# Patient Record
Sex: Female | Born: 1990 | Race: White | Hispanic: Yes | Marital: Single | Smoking: Never smoker
Health system: Southern US, Community
[De-identification: ages and names within clinical notes are randomized; demographics above are authoritative.]

---
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
## 2020-09-26 ENCOUNTER — Ambulatory Visit (INDEPENDENT_AMBULATORY_CARE_PROVIDER_SITE_OTHER): Payer: Self-pay

## 2020-09-26 ENCOUNTER — Encounter (HOSPITAL_COMMUNITY): Payer: Self-pay | Admitting: Emergency Medicine

## 2020-09-26 ENCOUNTER — Ambulatory Visit (HOSPITAL_COMMUNITY)
Admission: EM | Admit: 2020-09-26 | Discharge: 2020-09-26 | Disposition: A | Discharge status: Home / Self Care | Payer: Self-pay | Attending: Emergency Medicine | Admitting: Emergency Medicine

## 2020-09-26 ENCOUNTER — Other Ambulatory Visit: Payer: Self-pay

## 2020-09-26 DIAGNOSIS — M545 Low back pain, unspecified: Secondary | ICD-10-CM

## 2020-09-26 DIAGNOSIS — S299XXA Unspecified injury of thorax, initial encounter: Secondary | ICD-10-CM

## 2020-09-26 MED ORDER — METHOCARBAMOL 500 MG PO TABS: 500.0000 mg | ORAL_TABLET | Freq: Three times a day (TID) | ORAL | 0 refills | Status: AC | PRN

## 2020-09-26 MED ORDER — MELOXICAM 7.5 MG PO TABS: 7.5000 mg | ORAL_TABLET | Freq: Two times a day (BID) | ORAL | 1 refills | Status: AC
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
## 2020-09-26 NOTE — Discharge Instructions (Signed)
Take meloxicam and Robaxin as directed. 

## 2020-10-26 NOTE — L&D Delivery Note (Signed)
Delivery Note She progressed to complete and pushed well.  At 10:53 AM a viable female was delivered via Vaginal, Spontaneous (Presentation: Left Occiput Transverse).  APGAR: 9, 9; weight  .   Placenta status: Spontaneous, Intact.  Cord: 3 vessels with the following complications: Loose nuchal x 1 reduced   Anesthesia: Local Episiotomy: None Lacerations: Bilateral Labial;1st degree;Perineal Suture Repair: 3.0 vicryl rapide Est. Blood Loss (mL):  150  Mom to postpartum.  Baby to Couplet care / Skin to Skin.  They do not want baby circumcised  Leighton Roach Paolo Okane 07/21/2021, 11:22 AM

## 2020-12-23 LAB — OB RESULTS CONSOLE GC/CHLAMYDIA: Chlamydia: NEGATIVE

## 2020-12-23 LAB — OB RESULTS CONSOLE HEPATITIS B SURFACE ANTIGEN: Hepatitis B Surface Ag: NEGATIVE

## 2020-12-23 LAB — OB RESULTS CONSOLE RUBELLA ANTIBODY, IGM: Rubella: IMMUNE
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
## 2021-05-08 LAB — OB RESULTS CONSOLE HIV ANTIBODY (ROUTINE TESTING): HIV: NONREACTIVE

## 2021-07-03 LAB — OB RESULTS CONSOLE GBS: GBS: NEGATIVE

## 2021-07-20 ENCOUNTER — Inpatient Hospital Stay (HOSPITAL_COMMUNITY)
Admission: AD | Admit: 2021-07-20 | Discharge: 2021-07-23 | DRG: 806 | Disposition: A | Discharge status: Home / Self Care | Payer: Self-pay | Attending: Obstetrics and Gynecology | Admitting: Obstetrics and Gynecology

## 2021-07-20 ENCOUNTER — Other Ambulatory Visit: Payer: Self-pay

## 2021-07-20 ENCOUNTER — Encounter (HOSPITAL_COMMUNITY): Payer: Self-pay | Admitting: Obstetrics and Gynecology

## 2021-07-20 DIAGNOSIS — O4292 Full-term premature rupture of membranes, unspecified as to length of time between rupture and onset of labor: Principal | ICD-10-CM | POA: Diagnosis present

## 2021-07-20 DIAGNOSIS — Z3A38 38 weeks gestation of pregnancy: Secondary | ICD-10-CM

## 2021-07-20 DIAGNOSIS — O99324 Drug use complicating childbirth: Secondary | ICD-10-CM | POA: Diagnosis present

## 2021-07-20 DIAGNOSIS — F1290 Cannabis use, unspecified, uncomplicated: Secondary | ICD-10-CM | POA: Diagnosis present

## 2021-07-20 DIAGNOSIS — Z20822 Contact with and (suspected) exposure to covid-19: Secondary | ICD-10-CM | POA: Diagnosis present

## 2021-07-20 LAB — POCT FERN TEST: POCT Fern Test: POSITIVE

## 2021-07-20 NOTE — MAU Note (Signed)
Shelby Mathews is a 30 y.o. at [redacted]w[redacted]d here in MAU reporting: possible SROM at 0900 this morning noting clear fluid. Has continued to leak throughout the day-reports at 1930 this evening noted the fluid had pink tinge and she began feeling cramping in lower abdomen. States baby is active . Denies problems or complications with pregnancy  Vitals:   07/20/21 2202 07/20/21 2235  BP: 128/87 113/75  Pulse: 79 87  Resp: 16 16  Temp: 98.1 F (36.7 C) 98.5 F (36.9 C)  SpO2: 100% 99%     FHT:137 Lab orders placed from triage:  mau labor

## 2021-07-20 NOTE — H&P (Signed)
30 y.o. [redacted]w[redacted]d  G1P0 pt of [facility] comes in c/o SROM this morning with small trickle but did not come in until the fluid looked pinkish this pm.  Otherwise has good fetal movement and no bleeding.  History reviewed. No pertinent past medical history. History reviewed. No pertinent surgical history.  OB History  Gravida Para Term Preterm AB Living  1            SAB IAB Ectopic Multiple Live Births               # Outcome Date GA Lbr Len/2nd Weight Sex Delivery Anes PTL Lv  1 Current             Social History   Socioeconomic History   Marital status: Single    Spouse name: Not on file   Number of children: Not on file   Years of education: Not on file   Highest education level: Not on file  Occupational History   Not on file  Tobacco Use   Smoking status: Never   Smokeless tobacco: Never  Substance and Sexual Activity   Alcohol use: Yes    Comment: rare   Drug use: Yes    Types: Marijuana   Sexual activity: Not on file  Other Topics Concern   Not on file  Social History Narrative   Not on file   Social Determinants of Health   Financial Resource Strain: Not on file  Food Insecurity: Not on file  Transportation Needs: Not on file  Physical Activity: Not on file  Stress: Not on file  Social Connections: Not on file  Intimate Partner Violence: Not on file   Patient has no known allergies.    Prenatal Transfer Tool  Maternal Diabetes: No Genetic Screening: Normal Maternal Ultrasounds/Referrals: Normal Fetal Ultrasounds or other Referrals:  None Maternal Substance Abuse:  No Significant Maternal Medications:  None Significant Maternal Lab Results: Group B Strep negative  Other PNC: uncomplicated.    Vitals:   07/20/21 2202 07/20/21 2233 07/20/21 2235  BP: 128/87  113/75  Pulse: 79  87  Resp: 16  16  Temp: 98.1 F (36.7 C)  98.5 F (36.9 C)  TempSrc:   Oral  SpO2: 100%  99%  Weight:  63.5 kg 64.5 kg  Height:  5\' 1"  (1.549 m) 5\' 1"  (1.549 m)     Lungs/Cor:  NAD Abdomen:  soft, gravid Ex:  no cords, erythema SVE:  1.5/90/-1 FHTs:  140s, good STV, NST R; Cat 1 tracing. Toco:  q occ   A/P   Term SROM, possibly since 0900 but NST R and rare contractions.  GBS neg.  Will start pit.  

## 2021-07-20 NOTE — ED Provider Notes (Signed)
Emergency Medicine Provider Triage Evaluation Note  Shelby Mathews , a 30 y.o. female  was evaluated in triage.  Pt complains of vaginal bleeding.  She is [redacted] weeks pregnant.  States she felt a rush of fluids this morning and has had spotting ever since.  Denies contractions, is in no distress at this time.  Review of Systems  Positive: Vaginal bleeding Negative: Contractions  Physical Exam  BP 128/87 (BP Location: Right Arm)   Pulse 79   Temp 98.1 F (36.7 C)   Resp 16   SpO2 100%  Gen:   Awake, no distress   Resp:  Normal effort  MSK:   Moves extremities without difficulty    Medical Decision Making  Medically screening exam initiated at 10:02 PM.  Appropriate orders placed.  Shelby Mathews was informed that the remainder of the evaluation will be completed by another provider, this initial triage assessment does not replace that evaluation, and the importance of remaining in the ED until their evaluation is complete.  Denny Peon, MAU APP called and accepted patient for transfer   Vear Clock 07/20/21 2205    Benjiman Core, MD 07/21/21 [phone]

## 2021-07-20 NOTE — ED Triage Notes (Signed)
The pt is [redacted] weeks pregnant  lmp oct 8th  edc dec  fluid leaking this am  this evening some light vaginal bleeding c/o some mild lower abd cramps.   Mau called  will be sendinf

## 2021-07-21 ENCOUNTER — Encounter (HOSPITAL_COMMUNITY): Payer: Self-pay | Admitting: Obstetrics and Gynecology

## 2021-07-21 LAB — TYPE AND SCREEN
ABO/RH(D): AB POS
Antibody Screen: NEGATIVE

## 2021-07-21 LAB — CBC
HCT: 35.1 % — ABNORMAL LOW (ref 36.0–46.0)
Hemoglobin: 11.6 g/dL — ABNORMAL LOW (ref 12.0–15.0)
MCH: 30.4 pg (ref 26.0–34.0)
MCHC: 33 g/dL (ref 30.0–36.0)
MCV: 92.1 fL (ref 80.0–100.0)
Platelets: 231 10*3/uL (ref 150–400)
RBC: 3.81 MIL/uL — ABNORMAL LOW (ref 3.87–5.11)
RDW: 14.3 % (ref 11.5–15.5)
WBC: 7.7 10*3/uL (ref 4.0–10.5)
nRBC: 0 % (ref 0.0–0.2)

## 2021-07-21 LAB — RESP PANEL BY RT-PCR (FLU A&B, COVID) ARPGX2
Influenza A by PCR: NEGATIVE
Influenza B by PCR: NEGATIVE
SARS Coronavirus 2 by RT PCR: NEGATIVE

## 2021-07-21 LAB — RPR: RPR Ser Ql: NONREACTIVE

## 2021-07-21 LAB — POCT FERN TEST

## 2021-07-21 MED ORDER — IBUPROFEN 600 MG PO TABS
600.0000 mg | ORAL_TABLET | Freq: Four times a day (QID) | ORAL | Status: DC
  Administered 2021-07-21 – 2021-07-23 (×9): 600 mg via ORAL
  Filled 2021-07-21 (×9): qty 1

## 2021-07-21 MED ORDER — ONDANSETRON HCL 4 MG PO TABS: 4.0000 mg | ORAL_TABLET | ORAL | Status: DC | PRN

## 2021-07-21 MED ORDER — COCONUT OIL OIL: 1.0000 "application " | TOPICAL_OIL | Status: DC | PRN

## 2021-07-21 MED ORDER — ACETAMINOPHEN 325 MG PO TABS: 650.0000 mg | ORAL_TABLET | ORAL | Status: DC | PRN

## 2021-07-21 MED ORDER — EPHEDRINE 5 MG/ML INJ: 10.0000 mg | INTRAVENOUS | Status: DC | PRN

## 2021-07-21 MED ORDER — OXYTOCIN BOLUS FROM INFUSION
333.0000 mL | Freq: Once | INTRAVENOUS | Status: AC
  Administered 2021-07-21: 333 mL via INTRAVENOUS

## 2021-07-21 MED ORDER — LACTATED RINGERS IV SOLN: INTRAVENOUS | Status: DC

## 2021-07-21 MED ORDER — OXYCODONE-ACETAMINOPHEN 5-325 MG PO TABS: 2.0000 | ORAL_TABLET | ORAL | Status: DC | PRN

## 2021-07-21 MED ORDER — WITCH HAZEL-GLYCERIN EX PADS: 1.0000 "application " | MEDICATED_PAD | CUTANEOUS | Status: DC | PRN

## 2021-07-21 MED ORDER — METHYLERGONOVINE MALEATE 0.2 MG PO TABS
0.2000 mg | ORAL_TABLET | ORAL | Status: DC | PRN
Start: 2021-07-21 — End: 2021-07-23

## 2021-07-21 MED ORDER — OXYTOCIN-SODIUM CHLORIDE 30-0.9 UT/500ML-% IV SOLN
1.0000 m[IU]/min | INTRAVENOUS | Status: DC
  Administered 2021-07-21: 2 m[IU]/min via INTRAVENOUS
  Filled 2021-07-21: qty 500

## 2021-07-21 MED ORDER — SENNOSIDES-DOCUSATE SODIUM 8.6-50 MG PO TABS
2.0000 | ORAL_TABLET | Freq: Every day | ORAL | Status: DC
  Administered 2021-07-22 – 2021-07-23 (×2): 2 via ORAL
  Filled 2021-07-21 (×2): qty 2

## 2021-07-21 MED ORDER — TETANUS-DIPHTH-ACELL PERTUSSIS 5-2.5-18.5 LF-MCG/0.5 IM SUSY: 0.5000 mL | PREFILLED_SYRINGE | Freq: Once | INTRAMUSCULAR | Status: DC

## 2021-07-21 MED ORDER — ZOLPIDEM TARTRATE 5 MG PO TABS: 5.0000 mg | ORAL_TABLET | Freq: Every evening | ORAL | Status: DC | PRN

## 2021-07-21 MED ORDER — EPHEDRINE 5 MG/ML INJ
10.0000 mg | INTRAVENOUS | Status: DC | PRN
Start: 2021-07-21 — End: 2021-07-21

## 2021-07-21 MED ORDER — TERBUTALINE SULFATE 1 MG/ML IJ SOLN: 0.2500 mg | Freq: Once | INTRAMUSCULAR | Status: DC | PRN

## 2021-07-21 MED ORDER — PHENYLEPHRINE 40 MCG/ML (10ML) SYRINGE FOR IV PUSH (FOR BLOOD PRESSURE SUPPORT): 80.0000 ug | PREFILLED_SYRINGE | INTRAVENOUS | Status: DC | PRN

## 2021-07-21 MED ORDER — SOD CITRATE-CITRIC ACID 500-334 MG/5ML PO SOLN: 30.0000 mL | ORAL | Status: DC | PRN

## 2021-07-21 MED ORDER — LACTATED RINGERS IV SOLN: 500.0000 mL | INTRAVENOUS | Status: DC | PRN

## 2021-07-21 MED ORDER — OXYCODONE HCL 5 MG PO TABS: 5.0000 mg | ORAL_TABLET | ORAL | Status: DC | PRN

## 2021-07-21 MED ORDER — FENTANYL-BUPIVACAINE-NACL 0.5-0.125-0.9 MG/250ML-% EP SOLN: 12.0000 mL/h | EPIDURAL | Status: DC | PRN

## 2021-07-21 MED ORDER — SIMETHICONE 80 MG PO CHEW: 80.0000 mg | CHEWABLE_TABLET | ORAL | Status: DC | PRN

## 2021-07-21 MED ORDER — FLEET ENEMA 7-19 GM/118ML RE ENEM: 1.0000 | ENEMA | RECTAL | Status: DC | PRN

## 2021-07-21 MED ORDER — DIPHENHYDRAMINE HCL 25 MG PO CAPS: 25.0000 mg | ORAL_CAPSULE | Freq: Four times a day (QID) | ORAL | Status: DC | PRN

## 2021-07-21 MED ORDER — DIBUCAINE (PERIANAL) 1 % EX OINT: 1.0000 "application " | TOPICAL_OINTMENT | CUTANEOUS | Status: DC | PRN

## 2021-07-21 MED ORDER — OXYTOCIN-SODIUM CHLORIDE 30-0.9 UT/500ML-% IV SOLN: 2.5000 [IU]/h | INTRAVENOUS | Status: DC

## 2021-07-21 MED ORDER — OXYCODONE HCL 5 MG PO TABS: 10.0000 mg | ORAL_TABLET | ORAL | Status: DC | PRN

## 2021-07-21 MED ORDER — LACTATED RINGERS IV SOLN: 500.0000 mL | Freq: Once | INTRAVENOUS | Status: DC

## 2021-07-21 MED ORDER — BENZOCAINE-MENTHOL 20-0.5 % EX AERO
1.0000 "application " | INHALATION_SPRAY | CUTANEOUS | Status: DC | PRN
  Administered 2021-07-21: 1 via TOPICAL
  Filled 2021-07-21: qty 56

## 2021-07-21 MED ORDER — MEASLES, MUMPS & RUBELLA VAC IJ SOLR: 0.5000 mL | Freq: Once | INTRAMUSCULAR | Status: DC

## 2021-07-21 MED ORDER — PRENATAL MULTIVITAMIN CH
1.0000 | ORAL_TABLET | Freq: Every day | ORAL | Status: DC
  Administered 2021-07-22 – 2021-07-23 (×2): 1 via ORAL
  Filled 2021-07-21 (×2): qty 1

## 2021-07-21 MED ORDER — LIDOCAINE HCL (PF) 1 % IJ SOLN
30.0000 mL | INTRAMUSCULAR | Status: AC | PRN
  Administered 2021-07-21: 30 mL via SUBCUTANEOUS
  Filled 2021-07-21: qty 30

## 2021-07-21 MED ORDER — OXYCODONE-ACETAMINOPHEN 5-325 MG PO TABS: 1.0000 | ORAL_TABLET | ORAL | Status: DC | PRN

## 2021-07-21 MED ORDER — METHYLERGONOVINE MALEATE 0.2 MG/ML IJ SOLN
0.2000 mg | INTRAMUSCULAR | Status: DC | PRN
Start: 2021-07-21 — End: 2021-07-23

## 2021-07-21 MED ORDER — MAGNESIUM HYDROXIDE 400 MG/5ML PO SUSP: 30.0000 mL | ORAL | Status: DC | PRN

## 2021-07-21 MED ORDER — ONDANSETRON HCL 4 MG/2ML IJ SOLN: 4.0000 mg | INTRAMUSCULAR | Status: DC | PRN

## 2021-07-21 MED ORDER — DIPHENHYDRAMINE HCL 50 MG/ML IJ SOLN: 12.5000 mg | INTRAMUSCULAR | Status: DC | PRN

## 2021-07-21 MED ORDER — ONDANSETRON HCL 4 MG/2ML IJ SOLN: 4.0000 mg | Freq: Four times a day (QID) | INTRAMUSCULAR | Status: DC | PRN

## 2021-07-21 MED ORDER — BUTORPHANOL TARTRATE 1 MG/ML IJ SOLN
1.0000 mg | INTRAMUSCULAR | Status: DC | PRN
  Administered 2021-07-21 (×3): 1 mg via INTRAVENOUS
  Filled 2021-07-21 (×3): qty 1

## 2021-07-21 NOTE — Lactation Note (Signed)
This note was copied from a baby's chart. Lactation Consultation Note  Patient Name: Shelby Mathews [id number] Date: 07/21/2021 Reason for consult: L&D Initial assessment;Mother's request;Primapara;1st time breastfeeding;Early term 37-38.6wks Age: < 1hr  Infant latched with signs of milk transfer.  Mom to call for latch assistance on the floor.  Mom wants to BF and bottle feed, need a pump to support her milk supply.  All questions answered at end of the visit.   Maternal Data Has patient been taught Hand Expression?: Yes Does the patient have breastfeeding experience prior to this delivery?: No  Feeding Mother's Current Feeding Choice: Breast Milk and Formula  LATCH Score Latch: Repeated attempts needed to sustain latch, nipple held in mouth throughout feeding, stimulation needed to elicit sucking reflex.  Audible Swallowing: Spontaneous and intermittent  Type of Nipple: Everted at rest and after stimulation  Comfort (Breast/Nipple): Soft / non-tender  Hold (Positioning): Assistance needed to correctly position infant at breast and maintain latch.  LATCH Score: 8   Lactation Tools Discussed/Used    Interventions Interventions: Breast feeding basics reviewed;Breast compression;Assisted with latch;Skin to skin;Support pillows;Position options;Hand express;Expressed milk;Education  Discharge    Consult Status Consult Status: Follow-up from L&D Date: 07/22/21 Follow-up type: In-patient    Joelyn Lover  Nicholson-Springer 07/21/2021, 12:25 PM

## 2021-07-21 NOTE — Lactation Note (Signed)
This note was copied from a baby's chart. Lactation Consultation Note  Patient Name: Shelby Mathews Date: 07/21/2021 Reason for consult: Initial assessment;Early term 37-38.6wks Age:29 hours  Initial visit to 12 hours old infant of a P1 mother. "Shelby Mathews" just had a bath and is skin to skin with mother. Per mother, he does not like the formula. Demonstrated hand expression, collected ~30mL and spoonfed. Bettey Mare is still cueing, provided assistance with latch.  Discussed normal newborn behavior and patterns, signs of good milk transfer, hunger cues, tummy size and benefits of skin to skin.  Provided a hand pump to assist with expression.  Plan: 1-Deep, comfortable latch and breastfeeding on demand or 8-12 times in 24h period. 2-Encouraged maternal rest, hydration and food intake.  3-Contact LC as needed for feeds/support/concerns/questions   All questions answered at this time. Provided Lactation services brochure and promoted [employer] booklet information.     Maternal Data Has patient been taught Hand Expression?: Yes Does the patient have breastfeeding experience prior to this delivery?: No  Feeding Mother's Current Feeding Choice: Breast Milk and Formula Nipple Type: Slow - flow  LATCH Score Latch: Grasps breast easily, tongue down, lips flanged, rhythmical sucking.  Audible Swallowing: Spontaneous and intermittent  Type of Nipple: Everted at rest and after stimulation  Comfort (Breast/Nipple): Soft / non-tender  Hold (Positioning): Assistance needed to correctly position infant at breast and maintain latch.  LATCH Score: 9   Lactation Tools Discussed/Used Tools: Pump;Flanges Flange Size: 24 Breast pump type: Manual Pump Education: Setup, frequency, and cleaning;Milk Storage Reason for Pumping: stimulation and supplementation Pumping frequency: as needed Pumped volume: 2 mL (with demonstration)  Interventions Interventions: Breast feeding basics  reviewed;Assisted with latch;Skin to skin;Breast massage;Hand express;Adjust position;Support pillows;Education;Hand pump;Expressed milk;Position options  Discharge Pump: Manual WIC Program: No  Consult Status Consult Status: Follow-up Date: 07/22/21 Follow-up type: In-patient    Maurilio Puryear A Higuera Ancidey 07/21/2021, 11:48 PM

## 2021-07-21 NOTE — Lactation Note (Signed)
This note was copied from a baby's chart. Lactation Consultation Note  Patient Name: Shelby Mathews [id number] Date: 07/21/2021 Age:29 hours  LC in to room for initial consult. Baby is getting a bath and assessment.  LC will come back to room as possible.   Feeding Nipple Type: Slow - flow  [name] 07/21/2021, 11:10 PM

## 2021-07-21 NOTE — Progress Notes (Signed)
Feeling ctx, doing ok with Stadol Afeb, VSS FHT- 120s, Cat I, ctx q 2-3 min on pitocin VE4/C/+2 per RN  Will continue pitocin, monitor progress, anticipate SVD

## 2021-07-22 NOTE — Lactation Note (Signed)
This note was copied from a baby's chart. Lactation Consultation Note  Patient Name: Shelby Mathews [id number] Date: 07/22/2021 Reason for consult: Follow-up assessment Age:29 hours  LC Follow Up Note:  Attempted to visit with mother, however, she is asleep at this time.  Will return later today.   Maternal Data    Feeding    LATCH Score                    Lactation Tools Discussed/Used    Interventions    Discharge    Consult Status Consult Status: Follow-up Date: 07/22/21 Follow-up type: In-patient    Madalyn Legner R Simra Fiebig 07/22/2021, 1:12 PM

## 2021-07-22 NOTE — Lactation Note (Signed)
This note was copied from a baby's chart. Lactation Consultation Note  Patient Name: Shelby Mathews [id number] Date: 07/22/2021 Reason for consult: Follow-up assessment Age:29 hours   LC Follow Up Note:  Attempted to visit with mother, however, she was asleep.  Will return later today.   Maternal Data    Feeding    LATCH Score                    Lactation Tools Discussed/Used    Interventions    Discharge    Consult Status Consult Status: Follow-up Date: 07/22/21 Follow-up type: In-patient    Robi Mitter R Mitra Duling 07/22/2021, 2:16 PM

## 2021-07-22 NOTE — Lactation Note (Signed)
This note was copied from a baby's chart. Lactation Consultation Note  Patient Name: Boy Jossalyn Forgione [id number] Date: 07/22/2021 Reason for consult: Follow-up assessment;Early term 37-38.6wks;Primapara;1st time breastfeeding Age:29 hours   P1 mother whose infant is now 66 hours old.  This is an ETI at 38+5 weeks.  Baby was being held by grandfather when I arrived.  Mother reported recently breast feeding.  She had no questions/concerns related to breast feeding.  Baby has been latching well; mother denied pain with latching.  Baby is voiding/stooling well.  She will continue to feed 8-12 times/24 hours or sooner if baby shows cues.  Suggested she call her RN/LC for assistance as desired.    Mother has a DEBP for home use.  Father and grandparents present and supportive.   Maternal Data Has patient been taught Hand Expression?: Yes Does the patient have breastfeeding experience prior to this delivery?: Yes  Feeding Mother's Current Feeding Choice: Breast Milk  LATCH Score Latch: Grasps breast easily, tongue down, lips flanged, rhythmical sucking.  Audible Swallowing: A few with stimulation  Type of Nipple: Everted at rest and after stimulation  Comfort (Breast/Nipple): Soft / non-tender  Hold (Positioning): Assistance needed to correctly position infant at breast and maintain latch.  LATCH Score: 8   Lactation Tools Discussed/Used Tools: Pump Flange Size: 24 Breast pump type: Manual Reason for Pumping: Breast stimulation Pumping frequency: PRN  Interventions Interventions: Education  Discharge Pump: Manual;Personal WIC Program: No  Consult Status Consult Status: Follow-up Date: 07/23/21 Follow-up type: In-patient    Dora Sims 07/22/2021, 5:28 PM
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
## 2021-07-22 NOTE — Progress Notes (Signed)
Post Partum Day 1 Subjective: no complaints, up ad lib, voiding, and tolerating PO  Objective: Blood pressure 96/63, pulse 92, temperature 98.7 F (37.1 C), temperature source Oral, resp. rate 18, height 5\' 1"  (1.549 m), weight 64.5 kg, last menstrual period 09/26/2020, SpO2 99 %, unknown if currently breastfeeding.  Physical Exam:  General: alert, cooperative, and appears stated age Lochia: appropriate Uterine Fundus: firm DVT Evaluation: No evidence of DVT seen on physical exam.  Recent Labs    07/20/21 2345  HGB 11.6*  HCT 35.1*    Assessment/Plan: Plan for discharge tomorrow Declines circ Breastfeeding   LOS: 2 days   07/22/21 07/22/2021, 10:42 AM

## 2021-07-23 MED ORDER — IBUPROFEN 800 MG PO TABS: 800.0000 mg | ORAL_TABLET | Freq: Three times a day (TID) | ORAL | 1 refills | Status: AC | PRN

## 2021-07-23 NOTE — Discharge Summary (Signed)
Postpartum Discharge Summary  Date of Service updated     Patient Name: Shelby Mathews DOB: 1991/04/16 MRN: [id number]  Date of admission: 07/20/2021 Delivery date:07/21/2021  Delivering provider: Jackelyn Knife, TODD  Date of discharge: 07/23/2021  Admitting diagnosis: Normal labor [O80, Z37.9] Intrauterine pregnancy: [redacted]w[redacted]d     Secondary diagnosis:  Active Problems:   SVD (spontaneous vaginal delivery)  Additional problems: none    Discharge diagnosis: Term Pregnancy Delivered                                              Post partum procedures: none Augmentation: Pitocin Complications: None  Hospital course: Induction of Labor With Vaginal Delivery   30 y.o. yo G1P1001 at [redacted]w[redacted]d was admitted to the hospital 07/20/2021 for induction of labor.  Indication for induction:  PROM .  Patient had an uncomplicated labor course as follows: Membrane Rupture Time/Date: 9:00 AM ,07/20/2021   Delivery Method:Vaginal, Spontaneous  Episiotomy: None  Lacerations:  Labial;1st degree;Perineal  Details of delivery can be found in separate delivery note.  Patient had a routine postpartum course. Patient is discharged home 07/23/21.  Newborn Data: Birth date:07/21/2021  Birth time:10:53 AM  Gender:Female  Living status:Living  Apgars:9 ,9  Weight:2855 g   Physical exam  Vitals:   07/22/21 0200 07/22/21 1528 07/22/21 2056 07/23/21 0500  BP: 96/63 (!) 100/59 (!) 91/58 (!) 84/58  Pulse: 92 77 89 72  Resp: 18 18 18 18   Temp: 98.7 F (37.1 C) 98.5 F (36.9 C) 98.5 F (36.9 C) 98.8 F (37.1 C)  TempSrc: Oral Oral Oral Oral  SpO2:      Weight:      Height:       General: alert, cooperative, and no distress Lochia: appropriate Uterine Fundus: firm Incision: N/A DVT Evaluation: No evidence of DVT seen on physical exam. Negative Homan's sign. No cords or calf tenderness. Labs: Lab Results  Component Value Date   WBC 7.7 07/20/2021   HGB 11.6 (L) 07/20/2021   HCT 35.1 (L) 07/20/2021    MCV 92.1 07/20/2021   PLT 231 07/20/2021   No flowsheet data found. Edinburgh Score: Edinburgh Postnatal Depression Scale Screening Tool 07/21/2021  I have been able to laugh and see the funny side of things. 0  I have looked forward with enjoyment to things. 0  I have blamed myself unnecessarily when things went wrong. 0  I have been anxious or worried for no good reason. 0  I have felt scared or panicky for no good reason. 0  Things have been getting on top of me. 0  I have been so unhappy that I have had difficulty sleeping. 0  I have felt sad or miserable. 0  I have been so unhappy that I have been crying. 0  The thought of harming myself has occurred to me. 0  Edinburgh Postnatal Depression Scale Total 0      After visit meds:  Allergies as of 07/23/2021   No Known Allergies      Medication List     TAKE these medications    ibuprofen 800 MG tablet Commonly known as: ADVIL Take 1 tablet (800 mg total) by mouth every 8 (eight) hours as needed.         Discharge home in stable condition Infant Feeding: Breast Infant Disposition:home with mother Discharge instruction: per After Visit  Summary and Postpartum booklet. Activity: Advance as tolerated. Pelvic rest for 6 weeks.  Diet: routine diet Anticipated Birth Control: Unsure Postpartum Appointment:6 weeks    07/23/2021 Carlisle Cater, MD

## 2021-07-23 NOTE — Lactation Note (Signed)
This note was copied from a baby's chart. Lactation Consultation Note  Patient Name: Shelby Mathews [id number] Date: 07/23/2021 Reason for consult: Follow-up assessment Age:29 hours   P1 mother whose infant is now 68 hours old.  This is an ETI at 38+5 weeks.  Baby was swaddled and asleep in the bassinet when I arrived.  Mother reported no concerns with breast feeding.  Last LATCH score was an 8; voiding/stooling well.  Reviewed feeding plan for after discharge.  Mother has a DEBP for home use.  No support person present at this time.   Maternal Data    Feeding    LATCH Score                    Lactation Tools Discussed/Used    Interventions Interventions: Breast feeding basics reviewed  Discharge Discharge Education: Engorgement and breast care  Consult Status Consult Status: Complete Date: 07/23/21 Follow-up type: Call as needed    Sharetta Ricchio R Lael Wetherbee 07/23/2021, 8:43 AM

## 2021-07-23 NOTE — Progress Notes (Signed)
Post Partum Day 2 Subjective: no complaints, up ad lib, voiding, and tolerating PO  Objective: Blood pressure (!) 84/58, pulse 72, temperature 98.8 F (37.1 C), temperature source Oral, resp. rate 18, height 5\' 1"  (1.549 m), weight 64.5 kg, last menstrual period 09/26/2020, SpO2 99 %, unknown if currently breastfeeding.  Physical Exam:  General: alert, cooperative, and appears stated age Lochia: appropriate Uterine Fundus: firm DVT Evaluation: No evidence of DVT seen on physical exam.  Recent Labs    07/20/21 2345  HGB 11.6*  HCT 35.1*     Assessment/Plan: Discharge home Declines circ Breastfeeding  LOS: 3 days   07/22/21 07/23/2021, 7:53 AM

## 2021-08-01 ENCOUNTER — Telehealth (HOSPITAL_COMMUNITY): Payer: Self-pay | Admitting: *Deleted

## 2021-08-01 NOTE — Telephone Encounter (Signed)
Left message to return nurse call.  Duffy Rhody, RN 08-01-2021 at 11:39am
